# Patient Record
Sex: Female | Born: 1950 | Race: White | Hispanic: No | Marital: Married | Smoking: Never smoker
Health system: Southern US, Community
[De-identification: ages and names within clinical notes are randomized; demographics above are authoritative.]

## PROBLEM LIST (undated history)

## (undated) DIAGNOSIS — M1990 Unspecified osteoarthritis, unspecified site: Secondary | ICD-10-CM

## (undated) DIAGNOSIS — E119 Type 2 diabetes mellitus without complications: Secondary | ICD-10-CM

## (undated) DIAGNOSIS — G4730 Sleep apnea, unspecified: Secondary | ICD-10-CM

## (undated) DIAGNOSIS — I10 Essential (primary) hypertension: Secondary | ICD-10-CM

## (undated) HISTORY — PX: ABDOMINAL HYSTERECTOMY: SHX81

## (undated) HISTORY — PX: NASAL SINUS SURGERY: SHX719

## (undated) HISTORY — PX: MEDIAL PARTIAL KNEE REPLACEMENT: SHX5965

## (undated) HISTORY — PX: GANGLION CYST EXCISION: SHX1691

## (undated) HISTORY — DX: Essential (primary) hypertension: I10

## (undated) HISTORY — DX: Unspecified osteoarthritis, unspecified site: M19.90

## (undated) HISTORY — DX: Sleep apnea, unspecified: G47.30

## (undated) HISTORY — PX: THYROID CYST EXCISION: SHX2511

## (undated) HISTORY — DX: Type 2 diabetes mellitus without complications: E11.9

---

## 2003-10-30 ENCOUNTER — Other Ambulatory Visit: Payer: Self-pay

## 2006-01-26 ENCOUNTER — Ambulatory Visit: Payer: Self-pay | Admitting: Family Medicine

## 2008-05-14 ENCOUNTER — Ambulatory Visit: Payer: Self-pay | Admitting: Otolaryngology

## 2008-05-20 ENCOUNTER — Ambulatory Visit: Payer: Self-pay | Admitting: Otolaryngology

## 2008-08-28 ENCOUNTER — Encounter: Payer: Self-pay | Admitting: Orthopedic Surgery

## 2008-08-29 ENCOUNTER — Encounter: Payer: Self-pay | Admitting: Orthopedic Surgery

## 2008-09-29 ENCOUNTER — Encounter: Payer: Self-pay | Admitting: Orthopedic Surgery

## 2009-01-08 ENCOUNTER — Encounter: Payer: Self-pay | Admitting: Orthopedic Surgery

## 2009-01-29 ENCOUNTER — Encounter: Payer: Self-pay | Admitting: Orthopedic Surgery

## 2009-03-01 ENCOUNTER — Encounter: Payer: Self-pay | Admitting: Orthopedic Surgery

## 2009-03-01 HISTORY — PX: ROTATOR CUFF REPAIR: SHX139

## 2009-10-28 ENCOUNTER — Encounter: Payer: Self-pay | Admitting: Orthopedic Surgery

## 2009-10-30 ENCOUNTER — Encounter: Payer: Self-pay | Admitting: Orthopedic Surgery

## 2009-11-14 ENCOUNTER — Ambulatory Visit: Payer: Self-pay | Admitting: Internal Medicine

## 2009-11-29 ENCOUNTER — Encounter: Payer: Self-pay | Admitting: Orthopedic Surgery

## 2009-12-30 ENCOUNTER — Encounter: Payer: Self-pay | Admitting: Orthopedic Surgery

## 2010-01-29 ENCOUNTER — Encounter: Payer: Self-pay | Admitting: Orthopedic Surgery

## 2012-06-16 ENCOUNTER — Ambulatory Visit: Payer: Self-pay | Admitting: Internal Medicine

## 2013-01-11 ENCOUNTER — Ambulatory Visit: Payer: Self-pay | Admitting: Internal Medicine

## 2013-01-23 ENCOUNTER — Ambulatory Visit: Payer: Self-pay | Admitting: Internal Medicine

## 2014-09-13 IMAGING — US ULTRASOUND LEFT BREAST
1 series · 8 of 8 positions shown · non-contrast
Comparison: Previous examinations

CLINICAL DATA: Recall from screening mammogram

EXAM:
DIGITAL DIAGNOSTIC  left breast MAMMOGRAM
ULTRASOUND left BREAST

[Series 1: ultrasound left breast · 0.11mm/px · 8 of 8 slices shown]
[im 1/8]
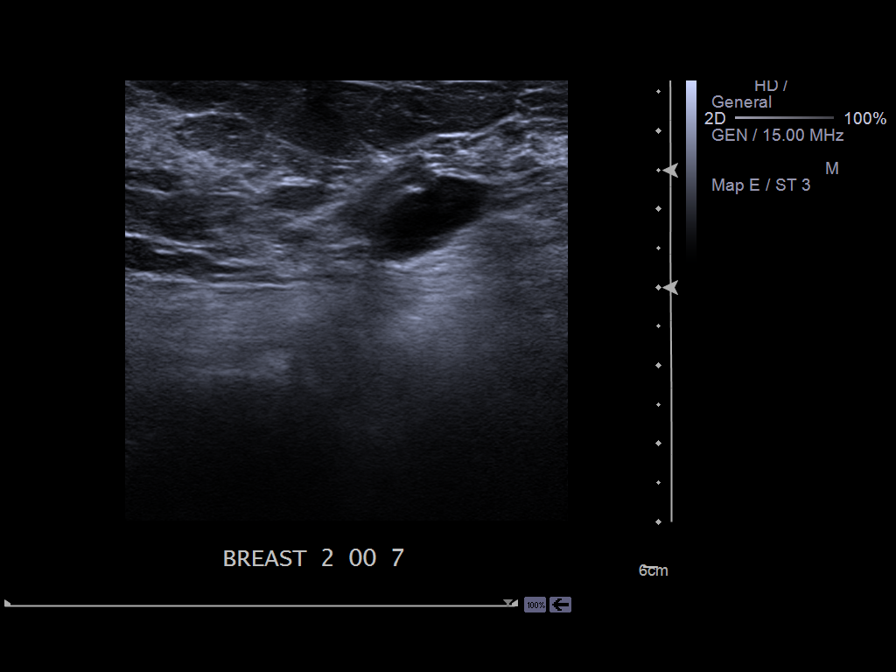
[im 2/8]
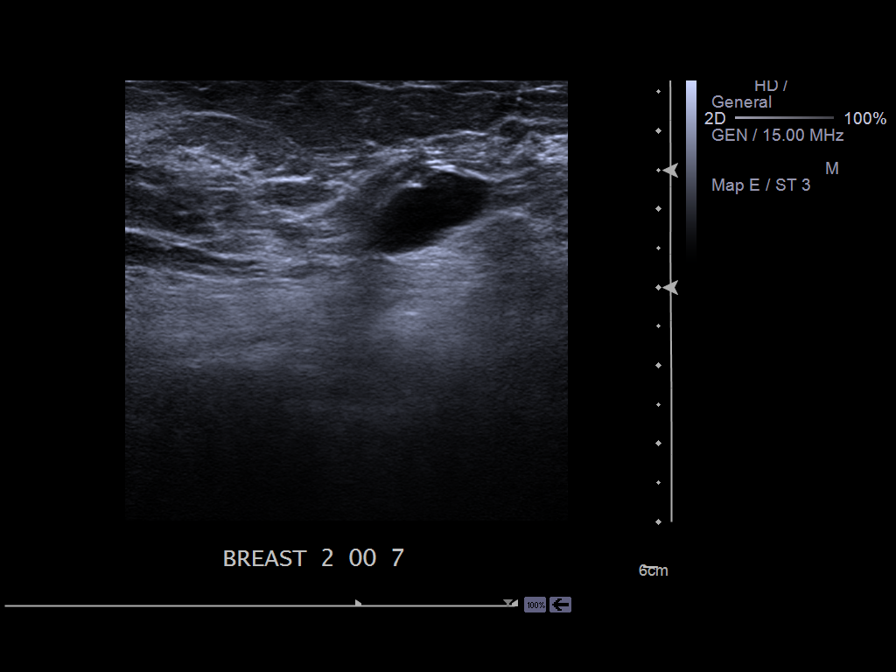
[im 3/8]
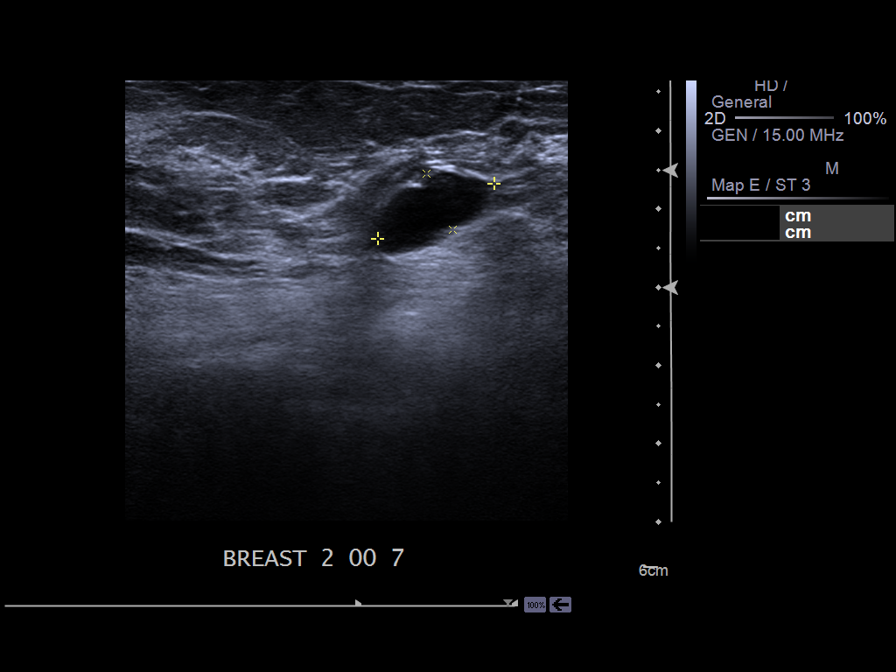
[im 4/8]
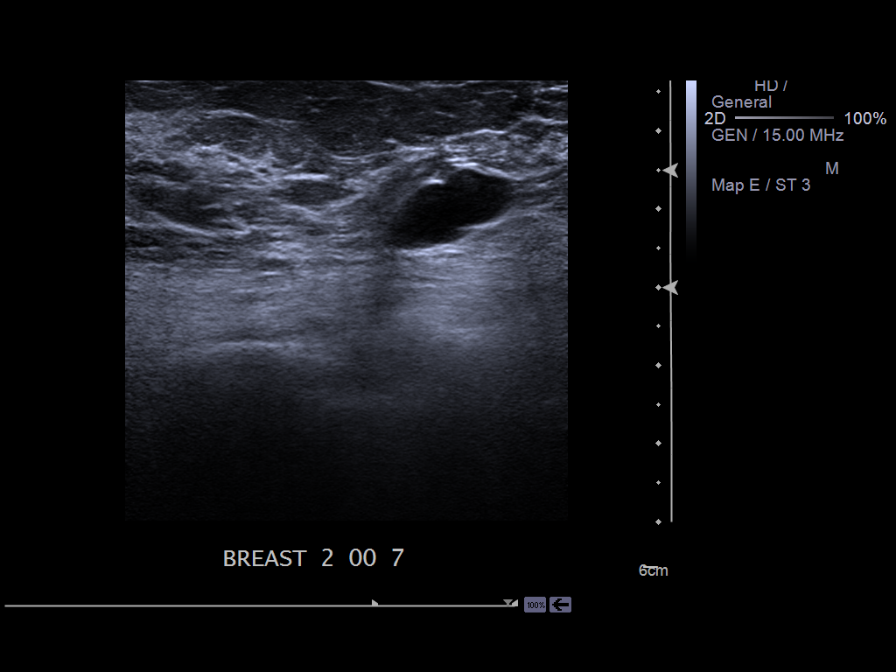
[im 5/8]
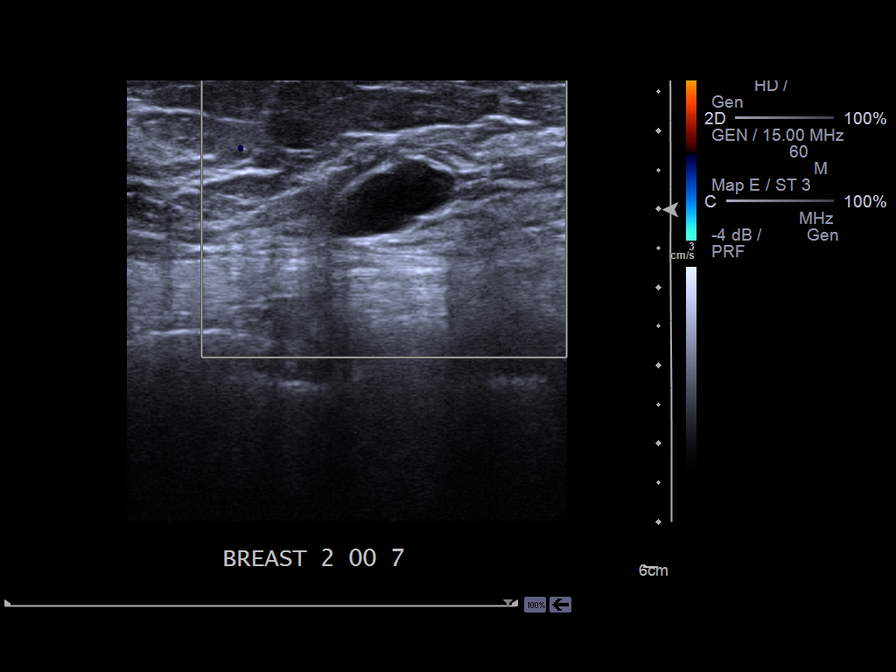
[im 6/8]
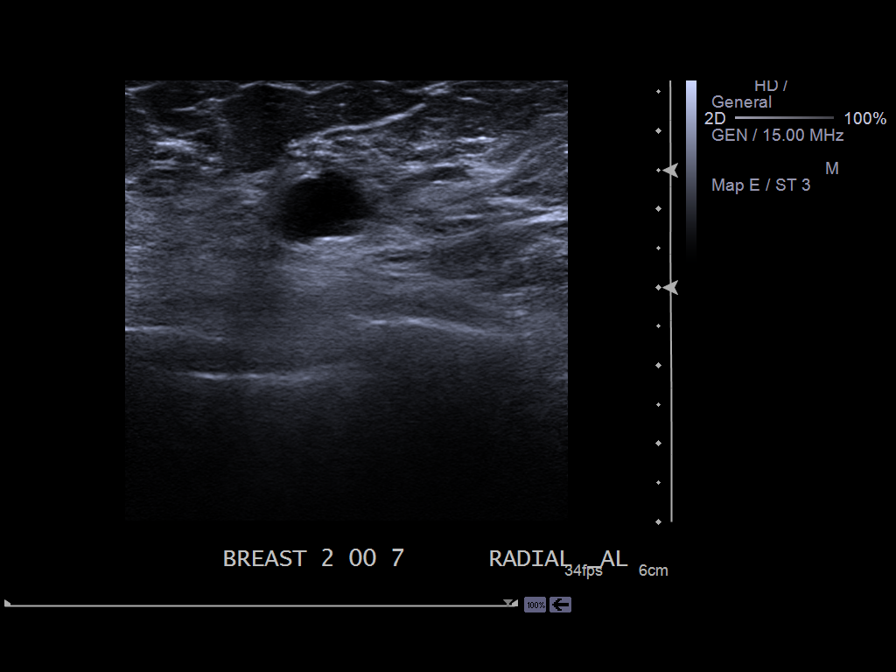
[im 7/8]
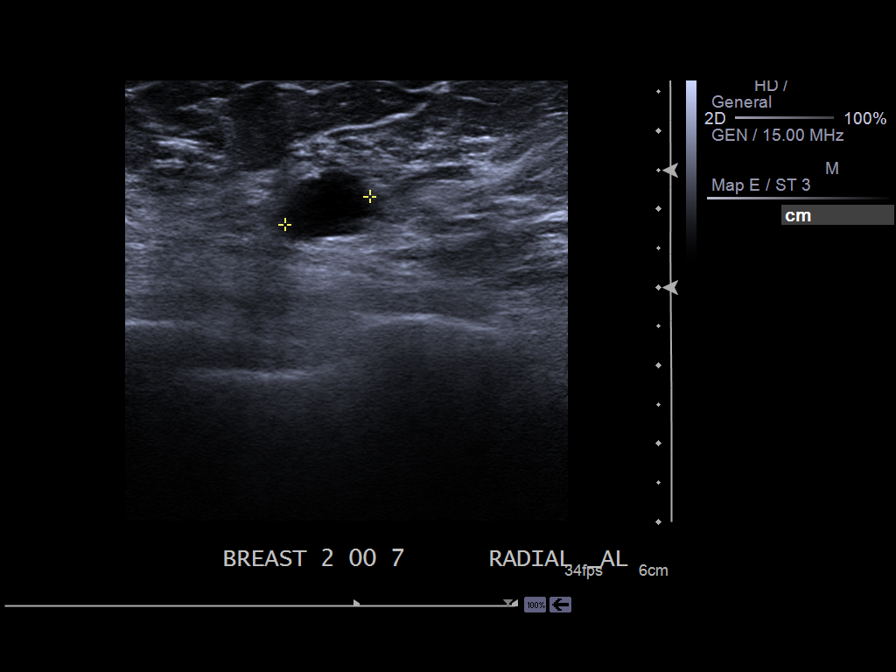
[im 8/8]
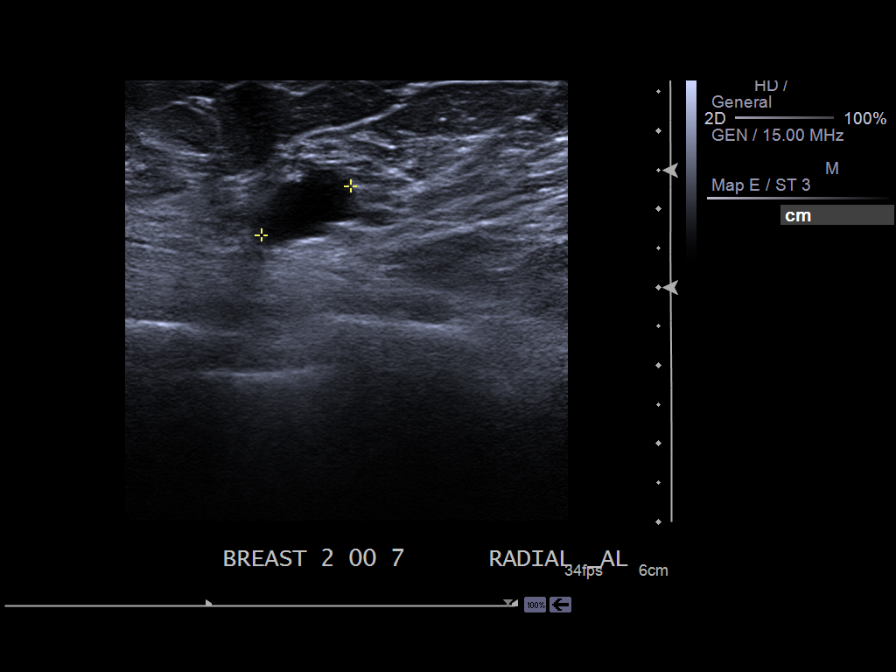

[8 of 8 positions shown; findings below may reference images not displayed]

ACR Breast Density Category b: There are scattered areas of
fibroglandular density.
FINDINGS: Spot compression views of the left breast demonstrate an oval,
circumscribed mass to be present within the left breast located
within the upper outer quadrant.

On physical exam there is no discrete palpable abnormality.

Ultrasound is performed, showing a simple cyst located within the
left breast at 2 o'clock position 7 cm from the nipple measuring
cm in size and corresponding to the mammographic finding. There is
no mass, distortion, or worrisome shadowing.
IMPRESSION: 1.7 cm simple cyst located within the left breast at 2 o'clock
position corresponding to the mammographic findings. No findings
worrisome for malignancy.

RECOMMENDATION:
Bilateral screening mammography in 1 year.

I have discussed the findings and recommendations with the patient.
Results were also provided in writing at the conclusion of the
visit. If applicable, a reminder letter will be sent to the patient
regarding the next appointment.

BI-RADS CATEGORY  2: Benign Finding(s)

## 2016-03-31 ENCOUNTER — Ambulatory Visit: Payer: Medicare HMO | Attending: Otolaryngology

## 2016-03-31 DIAGNOSIS — R0683 Snoring: Secondary | ICD-10-CM | POA: Diagnosis not present

## 2016-03-31 DIAGNOSIS — R400 Somnolence: Secondary | ICD-10-CM | POA: Insufficient documentation

## 2016-03-31 DIAGNOSIS — E669 Obesity, unspecified: Secondary | ICD-10-CM | POA: Diagnosis not present

## 2016-03-31 DIAGNOSIS — G4733 Obstructive sleep apnea (adult) (pediatric): Secondary | ICD-10-CM | POA: Insufficient documentation

## 2016-03-31 DIAGNOSIS — G4730 Sleep apnea, unspecified: Secondary | ICD-10-CM | POA: Diagnosis present

## 2016-04-28 ENCOUNTER — Ambulatory Visit: Payer: Medicare HMO | Attending: Otolaryngology

## 2016-04-28 DIAGNOSIS — R0683 Snoring: Secondary | ICD-10-CM | POA: Insufficient documentation

## 2016-04-28 DIAGNOSIS — G4733 Obstructive sleep apnea (adult) (pediatric): Secondary | ICD-10-CM | POA: Diagnosis present

## 2016-07-23 ENCOUNTER — Ambulatory Visit: Payer: Medicare HMO | Admitting: *Deleted

## 2017-01-13 ENCOUNTER — Encounter: Payer: Self-pay | Admitting: *Deleted

## 2017-01-13 ENCOUNTER — Encounter: Payer: Medicare HMO | Attending: Internal Medicine | Admitting: *Deleted

## 2017-01-13 VITALS — BP 140/72 | Ht 60.0 in | Wt 243.1 lb

## 2017-01-13 DIAGNOSIS — Z713 Dietary counseling and surveillance: Secondary | ICD-10-CM | POA: Insufficient documentation

## 2017-01-13 DIAGNOSIS — E119 Type 2 diabetes mellitus without complications: Secondary | ICD-10-CM

## 2017-01-13 NOTE — Patient Instructions (Signed)
Check blood sugars 2 x day before breakfast and 2 hrs after supper every day Bring blood sugar records to the next class  Exercise: Start walking  for  10-15  minutes 3  days a week and gradually increase to 150 minutes/week  Eat 3 meals day,  1  snack a day Space meals 4-6 hours apart Don't skip meals  Make an eye doctor appointment  Return for classes on:

## 2017-01-13 NOTE — Progress Notes (Signed)
Diabetes Self-Management Education  Visit Type: First/Initial  Appt. Start Time: 1345 Appt. End Time: 1535  01/13/2017  Ms. Hilbert BibleJudy Badal, identified by name and date of birth, is a 66 y.o. female with a diagnosis of Diabetes: Type 2.   ASSESSMENT  Blood pressure 140/72, height 5' (1.524 m), weight 243 lb 1.6 oz (110.3 kg). Body mass index is 47.48 kg/m.  Diabetes Self-Management Education - 01/13/17 1651      Visit Information   Visit Type  First/Initial      Initial Visit   Diabetes Type  Type 2    Are you currently following a meal plan?  No    Are you taking your medications as prescribed?  Yes    Date Diagnosed  3-4 months ago      Health Coping   How would you rate your overall health?  Fair      Psychosocial Assessment   Patient Belief/Attitude about Diabetes  Motivated to manage diabetes "I don't like it"   "I don't like it"   Self-care barriers  None    Self-management support  Doctor's office;Family;Friends    Patient Concerns  Nutrition/Meal planning;Medication;Monitoring;Healthy Lifestyle;Problem Solving;Glycemic Control;Weight Control    Special Needs  None    Preferred Learning Style  Auditory;Hands on    Learning Readiness  Ready    How often do you need to have someone help you when you read instructions, pamphlets, or other written materials from your doctor or pharmacy?  1 - Never    What is the last grade level you completed in school?  some college      Pre-Education Assessment   Patient understands the diabetes disease and treatment process.  Needs Instruction    Patient understands incorporating nutritional management into lifestyle.  Needs Instruction    Patient undertands incorporating physical activity into lifestyle.  Needs Instruction    Patient understands using medications safely.  Needs Instruction    Patient understands monitoring blood glucose, interpreting and using results  Needs Instruction    Patient understands prevention,  detection, and treatment of acute complications.  Needs Instruction    Patient understands prevention, detection, and treatment of chronic complications.  Needs Instruction    Patient understands how to develop strategies to address psychosocial issues.  Needs Instruction    Patient understands how to develop strategies to promote health/change behavior.  Needs Instruction      Complications   Last HgB A1C per patient/outside source  -- [facility] - only BG of 216 mg/dL in April 16102018.    [facility] only BG of 216 mg/dL in April 96042018.    How often do you check your blood sugar?  -- Pt reports that she has a meter but needs to have prescription filled for strips. She also reports difficulty obtaining blood sample. Discussed ways to obtain fingerstick blood sample. BG in the office was 188 mg/dL at 5:403:25 pm - 5 hrs pp.    Pt reports that she has a meter but needs to have prescription filled for strips. She also reports difficulty obtaining blood sample. Discussed ways to obtain fingerstick blood sample. BG in the office was 188 mg/dL at 9:813:25 pm - 5 hrs pp.    Have you had a dilated eye exam in the past 12 months?  No    Have you had a dental exam in the past 12 months?  No    Are you checking your  feet?  No      Dietary Intake   Breakfast  eggs, sausage or bacon; egg sandwich; biscuit with bacon or pork from [employer]    Snack (morning)  doesn't eat snacks    Lunch  skips    Dinner  chicken, beef, pork, salmon, shrimp with potatoes, green beans, occasional rice and pasta, salads    Beverage(s)  water, diet soda      Exercise   Exercise Type  ADL's      Patient Education   Previous Diabetes Education  No    Disease state   Definition of diabetes, type 1 and 2, and the diagnosis of diabetes;Factors that contribute to the development of diabetes    Nutrition management   Role of diet in the treatment of diabetes and the relationship between the three main  macronutrients and blood glucose level;Reviewed blood glucose goals for pre and post meals and how to evaluate the patients' food intake on their blood glucose level.    Physical activity and exercise   Role of exercise on diabetes management, blood pressure control and cardiac health.    Medications  Reviewed patients medication for diabetes, action, purpose, timing of dose and side effects.    Monitoring  Purpose and frequency of SMBG.;Taught/discussed recording of test results and interpretation of SMBG.;Identified appropriate SMBG and/or A1C goals.    Chronic complications  Relationship between chronic complications and blood glucose control;Retinopathy and reason for yearly dilated eye exams    Psychosocial adjustment  Identified and addressed patients feelings and concerns about diabetes      Individualized Goals (developed by patient)   Reducing Risk  Improve blood sugars Decrease medications Prevent diabetes complications Lose weight Lead a healthier lifestyle Become more fit     Outcomes   Expected Outcomes  Demonstrated interest in learning. Expect positive outcomes    Future DMSE  2 wks       Individualized Plan for Diabetes Self-Management Training:   Learning Objective:  Patient will have a greater understanding of diabetes self-management. Patient education plan is to attend individual and/or group sessions per assessed needs and concerns.   Plan:   Patient Instructions  Check blood sugars 2 x day before breakfast and 2 hrs after supper every day Bring blood sugar records to the next class Exercise: Start walking  for  10-15  minutes 3  days a week and gradually increase to 150 minutes/week Eat 3 meals day,  1  snack a day Space meals 4-6 hours apart Don't skip meals Make an eye doctor appointment  Expected Outcomes:  Demonstrated interest in learning. Expect positive outcomes  Education material provided:  General Meal Planning Guidelines Simple Meal Plan  If  problems or questions, patient to contact team via:  Sharion SettlerSheila Ourania Hamler, RN, CCM, CDE [phone]  Future DSME appointment: 2 wks  January 31, 2017 for Diabetes Class 1

## 2017-01-31 ENCOUNTER — Encounter: Payer: Self-pay | Admitting: Dietician

## 2017-01-31 ENCOUNTER — Encounter: Payer: Medicare HMO | Attending: Internal Medicine | Admitting: Dietician

## 2017-01-31 VITALS — Ht 60.0 in | Wt 240.3 lb

## 2017-01-31 DIAGNOSIS — Z713 Dietary counseling and surveillance: Secondary | ICD-10-CM | POA: Insufficient documentation

## 2017-01-31 DIAGNOSIS — E119 Type 2 diabetes mellitus without complications: Secondary | ICD-10-CM

## 2017-01-31 NOTE — Progress Notes (Signed)

## 2017-02-04 ENCOUNTER — Telehealth: Payer: Self-pay | Admitting: Dietician

## 2017-02-04 NOTE — Telephone Encounter (Signed)
Called patient to cancel class for 02/07/17. She will come on 02/14/17 for class 2. Patient voices concern over her BGs which are in 200s both fasting and post-prandial. Discussed factors that can affect BGs including sinus infection, disrupted sleep (unable to wear her C-pap right now), eating times and choices. She will contact MD by phone next week if BGs are still elevated, and will consider keeping a food diary to bring in for evaluation.

## 2017-02-14 ENCOUNTER — Encounter: Payer: Self-pay | Admitting: Dietician

## 2017-02-14 NOTE — Progress Notes (Signed)
Pt called and cancelled class 2 today due to being sick-pt is scheduled for class 3 on 02-15-17

## 2017-02-15 ENCOUNTER — Encounter: Payer: Medicare HMO | Admitting: Dietician

## 2017-02-15 ENCOUNTER — Encounter: Payer: Self-pay | Admitting: Dietician

## 2017-02-15 VITALS — BP 162/96 | Ht 60.0 in | Wt 236.1 lb

## 2017-02-15 DIAGNOSIS — E119 Type 2 diabetes mellitus without complications: Secondary | ICD-10-CM

## 2017-02-15 NOTE — Progress Notes (Signed)

## 2017-03-10 ENCOUNTER — Telehealth: Payer: Self-pay | Admitting: *Deleted

## 2017-03-10 ENCOUNTER — Ambulatory Visit: Payer: Medicare HMO

## 2017-03-10 NOTE — Telephone Encounter (Signed)
Patient was scheduled for Class 2 as make-up due to sickness last month. She didn't show. Called and left a message for her to call back and reschedule.

## 2017-04-11 ENCOUNTER — Encounter: Payer: Self-pay | Admitting: *Deleted

## 2023-04-28 ENCOUNTER — Ambulatory Visit
Admission: RE | Admit: 2023-04-28 | Discharge: 2023-04-28 | Disposition: A | Discharge status: Home / Self Care | Payer: Medicare Other | Source: Ambulatory Visit | Attending: Internal Medicine | Admitting: Internal Medicine

## 2023-04-28 ENCOUNTER — Other Ambulatory Visit: Payer: Self-pay | Admitting: Internal Medicine

## 2023-04-28 DIAGNOSIS — R000 Tachycardia, unspecified: Secondary | ICD-10-CM | POA: Insufficient documentation

## 2023-04-28 DIAGNOSIS — R0609 Other forms of dyspnea: Secondary | ICD-10-CM | POA: Insufficient documentation

## 2023-04-28 LAB — POCT I-STAT CREATININE: Creatinine, Ser: 0.7 mg/dL (ref 0.44–1.00)

## 2023-04-28 MED ORDER — IOHEXOL 350 MG/ML SOLN
75.0000 mL | Freq: Once | INTRAVENOUS | Status: AC | PRN
  Administered 2023-04-28: 75 mL via INTRAVENOUS

## 2023-07-26 ENCOUNTER — Other Ambulatory Visit: Payer: Self-pay | Admitting: Endocrinology

## 2023-07-26 DIAGNOSIS — E0590 Thyrotoxicosis, unspecified without thyrotoxic crisis or storm: Secondary | ICD-10-CM

## 2023-07-26 DIAGNOSIS — E042 Nontoxic multinodular goiter: Secondary | ICD-10-CM

## 2023-08-04 ENCOUNTER — Encounter
Admission: RE | Admit: 2023-08-04 | Discharge: 2023-08-04 | Discharge status: Home / Self Care | Source: Ambulatory Visit | Attending: Endocrinology | Admitting: Endocrinology

## 2023-08-04 ENCOUNTER — Encounter
Admission: RE | Admit: 2023-08-04 | Discharge: 2023-08-04 | Disposition: A | Discharge status: Home / Self Care | Source: Ambulatory Visit | Attending: Endocrinology | Admitting: Endocrinology

## 2023-08-04 DIAGNOSIS — E042 Nontoxic multinodular goiter: Secondary | ICD-10-CM | POA: Diagnosis present

## 2023-08-04 DIAGNOSIS — E0590 Thyrotoxicosis, unspecified without thyrotoxic crisis or storm: Secondary | ICD-10-CM | POA: Insufficient documentation

## 2023-08-04 MED ORDER — SODIUM IODIDE I-123 7.4 MBQ CAPS
441.0000 | ORAL_CAPSULE | Freq: Once | ORAL | Status: AC
  Administered 2023-08-04: 441 via ORAL

## 2023-08-05 ENCOUNTER — Encounter
Admission: RE | Admit: 2023-08-05 | Discharge: 2023-08-05 | Disposition: A | Discharge status: Home / Self Care | Source: Ambulatory Visit | Attending: Endocrinology | Admitting: Endocrinology

## 2023-09-08 ENCOUNTER — Other Ambulatory Visit: Payer: Self-pay | Admitting: Internal Medicine

## 2023-09-08 DIAGNOSIS — E0590 Thyrotoxicosis, unspecified without thyrotoxic crisis or storm: Secondary | ICD-10-CM

## 2023-09-08 DIAGNOSIS — E0520 Thyrotoxicosis with toxic multinodular goiter without thyrotoxic crisis or storm: Secondary | ICD-10-CM

## 2024-03-09 ENCOUNTER — Other Ambulatory Visit: Payer: Self-pay | Admitting: Internal Medicine

## 2024-03-09 DIAGNOSIS — Z1231 Encounter for screening mammogram for malignant neoplasm of breast: Secondary | ICD-10-CM
